# Patient Record
Sex: Male | Born: 1995
Health system: Southern US, Community
[De-identification: ages and names within clinical notes are randomized; demographics above are authoritative.]

## PROBLEM LIST (undated history)

## (undated) HISTORY — PX: OTHER SURGICAL HISTORY: SHX169

## (undated) HISTORY — PX: CIRCUMCISION: SHX1350

---
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
## 2017-07-27 ENCOUNTER — Other Ambulatory Visit: Payer: Self-pay

## 2017-07-27 ENCOUNTER — Encounter (HOSPITAL_COMMUNITY): Payer: Self-pay | Admitting: *Deleted

## 2017-07-27 ENCOUNTER — Emergency Department (HOSPITAL_COMMUNITY): Payer: 59

## 2017-07-27 ENCOUNTER — Emergency Department (HOSPITAL_COMMUNITY)
Admission: EM | Admit: 2017-07-27 | Discharge: 2017-07-27 | Disposition: A | Discharge status: Home / Self Care | Payer: 59 | Attending: Emergency Medicine | Admitting: Emergency Medicine

## 2017-07-27 DIAGNOSIS — N200 Calculus of kidney: Secondary | ICD-10-CM | POA: Diagnosis not present

## 2017-07-27 DIAGNOSIS — Z7722 Contact with and (suspected) exposure to environmental tobacco smoke (acute) (chronic): Secondary | ICD-10-CM | POA: Diagnosis not present

## 2017-07-27 DIAGNOSIS — R1031 Right lower quadrant pain: Secondary | ICD-10-CM | POA: Diagnosis present

## 2017-07-27 DIAGNOSIS — Z79899 Other long term (current) drug therapy: Secondary | ICD-10-CM | POA: Diagnosis not present

## 2017-07-27 LAB — CBC WITH DIFFERENTIAL/PLATELET
Basophils Absolute: 0 10*3/uL (ref 0.0–0.1)
Basophils Relative: 0 %
Eosinophils Absolute: 0 10*3/uL (ref 0.0–0.7)
Eosinophils Relative: 0 %
HCT: 43.8 % (ref 39.0–52.0)
Hemoglobin: 15.2 g/dL (ref 13.0–17.0)
Lymphocytes Relative: 9 %
Lymphs Abs: 1.1 10*3/uL (ref 0.7–4.0)
MCH: 31 pg (ref 26.0–34.0)
MCHC: 34.7 g/dL (ref 30.0–36.0)
MCV: 89.2 fL (ref 78.0–100.0)
Monocytes Absolute: 1 10*3/uL (ref 0.1–1.0)
Monocytes Relative: 9 %
Neutro Abs: 9.7 10*3/uL — ABNORMAL HIGH (ref 1.7–7.7)
Neutrophils Relative %: 82 %
Platelets: 247 10*3/uL (ref 150–400)
RBC: 4.91 MIL/uL (ref 4.22–5.81)
RDW: 12.8 % (ref 11.5–15.5)
WBC: 11.9 10*3/uL — ABNORMAL HIGH (ref 4.0–10.5)

## 2017-07-27 LAB — URINALYSIS, ROUTINE W REFLEX MICROSCOPIC
BILIRUBIN URINE: NEGATIVE
Bacteria, UA: NONE SEEN
GLUCOSE, UA: NEGATIVE mg/dL
Ketones, ur: 20 mg/dL — AB
Leukocytes, UA: NEGATIVE
NITRITE: NEGATIVE
PROTEIN: NEGATIVE mg/dL
Specific Gravity, Urine: >1.046 — ABNORMAL HIGH (ref 1.005–1.030)
pH: 5 (ref 5.0–8.0)

## 2017-07-27 LAB — COMPREHENSIVE METABOLIC PANEL
ALT: 17 U/L (ref 17–63)
AST: 21 U/L (ref 15–41)
Albumin: 4.5 g/dL (ref 3.5–5.0)
Alkaline Phosphatase: 71 U/L (ref 38–126)
Anion gap: 10 (ref 5–15)
BILIRUBIN TOTAL: 1.3 mg/dL — AB (ref 0.3–1.2)
BUN: 16 mg/dL (ref 6–20)
CHLORIDE: 103 mmol/L (ref 101–111)
CO2: 26 mmol/L (ref 22–32)
Calcium: 9.4 mg/dL (ref 8.9–10.3)
Creatinine, Ser: 0.83 mg/dL (ref 0.61–1.24)
Glucose, Bld: 90 mg/dL (ref 65–99)
POTASSIUM: 4.1 mmol/L (ref 3.5–5.1)
Sodium: 139 mmol/L (ref 135–145)
TOTAL PROTEIN: 7 g/dL (ref 6.5–8.1)

## 2017-07-27 LAB — LIPASE, BLOOD: LIPASE: 25 U/L (ref 11–51)

## 2017-07-27 MED ORDER — IOPAMIDOL (ISOVUE-300) INJECTION 61%
INTRAVENOUS | Status: AC
  Administered 2017-07-27: 100 mL
  Filled 2017-07-27: qty 100

## 2017-07-27 MED ORDER — KETOROLAC TROMETHAMINE 30 MG/ML IJ SOLN
30.0000 mg | Freq: Once | INTRAMUSCULAR | Status: AC
  Administered 2017-07-27: 30 mg via INTRAVENOUS
  Filled 2017-07-27: qty 1

## 2017-07-27 MED ORDER — KETOROLAC TROMETHAMINE 30 MG/ML IJ SOLN: 30.0000 mg | Freq: Once | INTRAMUSCULAR | Status: DC

## 2017-07-27 MED ORDER — HYDROCODONE-ACETAMINOPHEN 5-325 MG PO TABS: 1.0000 | ORAL_TABLET | ORAL | 0 refills | Status: DC | PRN

## 2017-07-27 NOTE — ED Provider Notes (Signed)
Patient placed in Quick Look pathway, seen and evaluated   Chief Complaint: Right lower quadrant abdominal pain since this morning HPI:   Right lower quadrant pain that started this morning after having a bowel movement.  Patient states that he did not take any medications prior to arrival.  No abdominal surgeries in the past.   ROS: Abdominal pain (one)  Physical Exam:   Gen: No distress  Neuro: Awake and Alert  Skin: Warm    Focused Exam: Right lower quadrant abdominal pain worse with palpation.   Initiation of care has begun. The patient has been counseled on the process, plan, and necessity for staying for the completion/evaluation, and the remainder of the medical screening examination    Charlestine NightLawyer, Lucylle Foulkes, PA-C 07/27/17 1516    Melene PlanFloyd, Dan, DO 07/27/17 [phone]

## 2017-07-27 NOTE — ED Triage Notes (Signed)
Pt went to go poop this am and then after started having pain to right lower quadrant.  Nauseated, no vomiting.  No back pain.  Pain with ambulation and urination.

## 2017-07-27 NOTE — ED Provider Notes (Addendum)
[facility] Provider Note   CSN: [id number] Arrival date & time: 07/27/17  1457   History   Chief Complaint Chief Complaint  Patient presents with  . Abdominal Pain    HPI Joe Daniel is a 22 y.o. male.  HPI   22 year old male presents today with complaints of right lower quadrant abdominal pain.  Patient reports that he was in his usual state of health this morning when he had acute sharp right lower quadrant pain.  Patient notes that symptoms are sharp in nature with no radiation.  He denies any history of the same.  No medications prior to arrival.  He denies any nausea or vomiting.  Reports normal urination.  No history of abdominal surgeries.  History reviewed. No pertinent past medical history.  Patient Active Problem List   Diagnosis Date Noted  . Migraine without aura and without status migrainosus, not intractable 12/27/2014    Past Surgical History:  Procedure Laterality Date  . CIRCUMCISION  at birth  . OTHER SURGICAL HISTORY     Sinus Surgery x2       Home Medications    Prior to Admission medications   Medication Sig Start Date End Date Taking? Authorizing Provider  amoxicillin-clavulanate (AUGMENTIN) 875-125 MG tablet Take 1 tablet by mouth 2 (two) times daily. 11/06/16   Gilda CreasePollina, Christopher J, MD  HYDROcodone-acetaminophen (NORCO/VICODIN) 5-325 MG tablet Take 1 tablet by mouth every 4 (four) hours as needed. 07/27/17   Pascale Maves, Tinnie GensJeffrey, PA-C  rizatriptan (MAXALT-MLT) 10 MG disintegrating tablet PLACE 1 TAB UNDER TONGUE AT ONSET OF MIGRAINE TAKE W/ 400MG  OF IBUPROFEN. CAN REPEAT IN 2 HOURS 04/28/16   Elveria RisingGoodpasture, Tina, NP    Family History No family history on file.  Social History Social History   Tobacco Use  . Smoking status: Passive Smoke Exposure - Never Smoker  . Smokeless tobacco: Never Used  . Tobacco comment: Sister smokes   Substance Use Topics  . Alcohol use: No    Alcohol/week: 0.0 oz  . Drug  use: No     Allergies   Patient has no known allergies.   Review of Systems Review of Systems  All other systems reviewed and are negative.  Physical Exam Updated Vital Signs BP 130/65   Pulse 78   Temp 98.1 F (36.7 C) (Oral)   Resp 18   SpO2 100%   Physical Exam  Constitutional: He is oriented to person, place, and time. He appears well-developed and well-nourished.  HENT:  Head: Normocephalic and atraumatic.  Eyes: Conjunctivae are normal. Pupils are equal, round, and reactive to light. Right eye exhibits no discharge. Left eye exhibits no discharge. No scleral icterus.  Neck: Normal range of motion. No JVD present. No tracheal deviation present.  Pulmonary/Chest: Effort normal. No stridor.  Abdominal: Soft. He exhibits no distension. There is no tenderness. There is no rebound and no guarding. No hernia.  Neurological: He is alert and oriented to person, place, and time. Coordination normal.  Psychiatric: He has a normal mood and affect. His behavior is normal. Judgment and thought content normal.  Nursing note and vitals reviewed.    ED Treatments / Results  Labs (all labs ordered are listed, but only abnormal results are displayed) Labs Reviewed  COMPREHENSIVE METABOLIC PANEL - Abnormal; Notable for the following components:      Result Value   Total Bilirubin 1.3 (*)    All other components within normal limits  URINALYSIS, ROUTINE W REFLEX MICROSCOPIC -  Abnormal; Notable for the following components:   Color, Urine STRAW (*)    Specific Gravity, Urine >1.046 (*)    Hgb urine dipstick MODERATE (*)    Ketones, ur 20 (*)    Squamous Epithelial / LPF 0-5 (*)    All other components within normal limits  CBC WITH DIFFERENTIAL/PLATELET - Abnormal; Notable for the following components:   WBC 11.9 (*)    Neutro Abs 9.7 (*)    All other components within normal limits  LIPASE, BLOOD    EKG  EKG Interpretation None       Radiology Ct Abdomen Pelvis W  Contrast  Result Date: 07/27/2017 CLINICAL DATA:  Right lower quadrant pain after bowel movement this morning. EXAM: CT ABDOMEN AND PELVIS WITH CONTRAST TECHNIQUE: Multidetector CT imaging of the abdomen and pelvis was performed using the standard protocol following bolus administration of intravenous contrast. CONTRAST:  ISOVUE-300 IOPAMIDOL (ISOVUE-300) INJECTION 61% COMPARISON:  None. FINDINGS: Lower chest: No acute abnormality. Hepatobiliary: No focal liver abnormality is seen. No gallstones, gallbladder wall thickening, or biliary dilatation. Pancreas: Unremarkable. No pancreatic ductal dilatation or surrounding inflammatory changes. Spleen: Normal in size without focal abnormality. Adrenals/Urinary Tract: Normal bilateral adrenal glands. Mild right-sided hydroureteronephrosis attributable to a 4 mm calculus in the mid right hemipelvis, series 3, image 69 and series 6, image 46. This is approximately 3-4 cm from the UVJ. Nondistended urinary bladder. Small phleboliths are seen in the lower pelvis bilaterally. Stomach/Bowel: Normal air-filled appendix. No bowel obstruction or inflammation. Physiologic distention of the stomach. Normal small-bowel rotation. Vascular/Lymphatic: No significant vascular findings are present. No enlarged abdominal or pelvic lymph nodes. Reproductive: Prostate is unremarkable. Other: Trace free fluid in the pelvis. Musculoskeletal: No acute or significant osseous findings. IMPRESSION: 1. 4 mm distal right ureteral calculus approximately 3-4 cm from the UVJ causing mild right-sided hydroureteronephrosis. 2. No acute bowel obstruction or inflammation. Normal air-filled appendix. Electronically Signed   By: Tollie Eth M.D.   On: 07/27/2017 17:04    Procedures Procedures (including critical care time)  Medications Ordered in ED Medications  iopamidol (ISOVUE-300) 61 % injection (100 mLs  Contrast Given 07/27/17 1647)  ketorolac (TORADOL) 30 MG/ML injection 30 mg (30 mg  Intravenous Given 07/27/17 1851)     Initial Impression / Assessment and Plan / ED Course  I have reviewed the triage vital signs and the nursing notes.  Pertinent labs & imaging results that were available during my care of the patient were reviewed by me and considered in my medical decision making (see chart for details).      Final Clinical Impressions(s) / ED Diagnoses   Final diagnoses:  Nephrolithiasis   Labs: UA, CBC,  Lipase,  CMP  Imaging: CT abd pelvis with   Consults:  Therapeutics: Toradol   Discharge Meds: Norco  Assessment/Plan: Pt with acute ureteral lithiasis -mild right sided hydronephrosis- no signs of infection, no complicating features. Well appearing in no acute distress.   Patient without any signs of infectious etiology.  Patient given discharge instructions for follow-up with urology, strict return precautions.  Both patient and his family verbalized understanding and agreement to this plan had no further questions or concerns    ED Discharge Orders        Ordered    HYDROcodone-acetaminophen (NORCO/VICODIN) 5-325 MG tablet  Every 4 hours PRN     07/27/17 1924       Eyvonne Mechanic, PA-C 07/27/17 1925    Eyvonne Mechanic, PA-C 07/27/17 1926  Benjiman Core, MD 07/27/17 2256

## 2017-07-27 NOTE — Discharge Instructions (Signed)
Please read attached information. If you experience any new or worsening signs or symptoms please return to the emergency room for evaluation. Please follow-up with your primary care provider or specialist as discussed. Please use medication prescribed only as directed and discontinue taking if you have any concerning signs or symptoms.   °

## 2018-06-01 IMAGING — CT CT ABD-PELV W/ CM
2 of 4 series · 16 of 46 positions shown, 18 images · IV contrast (APPLIED)
Comparison: None.

CLINICAL DATA: Right lower quadrant pain after bowel movement this
morning.

EXAM:
CT ABDOMEN AND PELVIS WITH CONTRAST
TECHNIQUE: Multidetector CT imaging of the abdomen and pelvis was performed
using the standard protocol following bolus administration of
intravenous contrast.
CONTRAST:  100mL RLGW0I-DVV IOPAMIDOL (RLGW0I-DVV) INJECTION 61%

[Series 3: abd/ pelvis 5.0 i30f 2 · axial · 0.66mm/px · z∈[+845,+1275]mm · 13 of 94 slices shown, 15 images]
[im 4/94  soft-tissue]
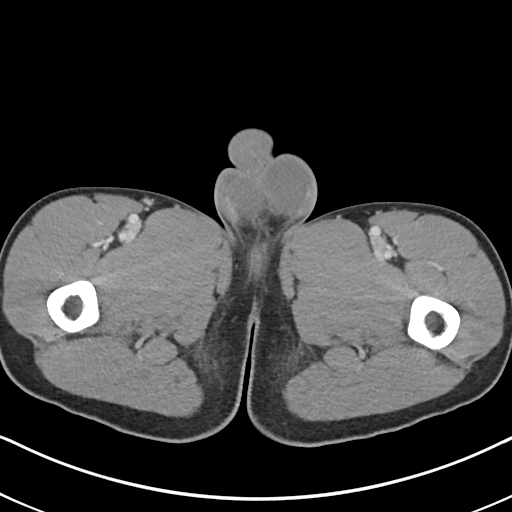
[im 4/94  bone]
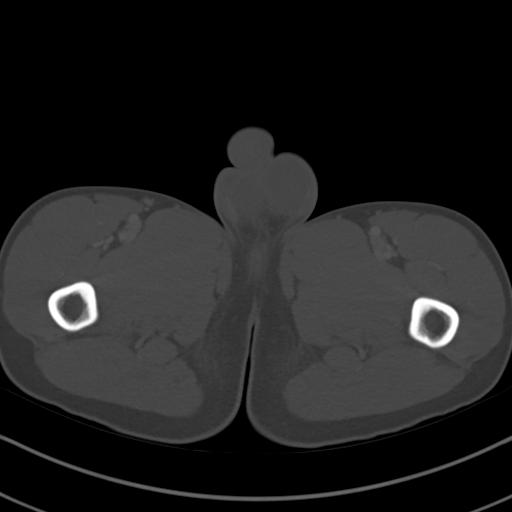
[im 12/94  soft-tissue]
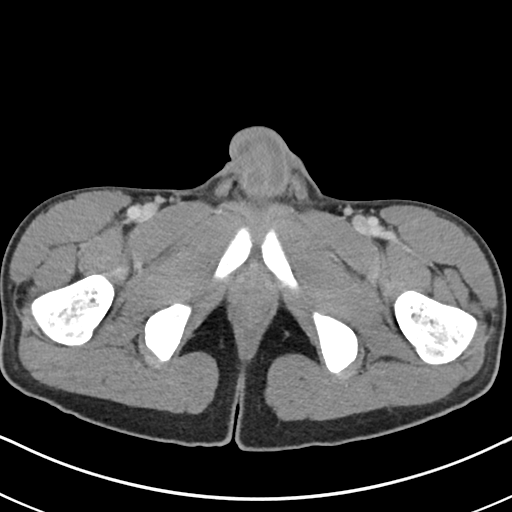
[im 20/94  soft-tissue]
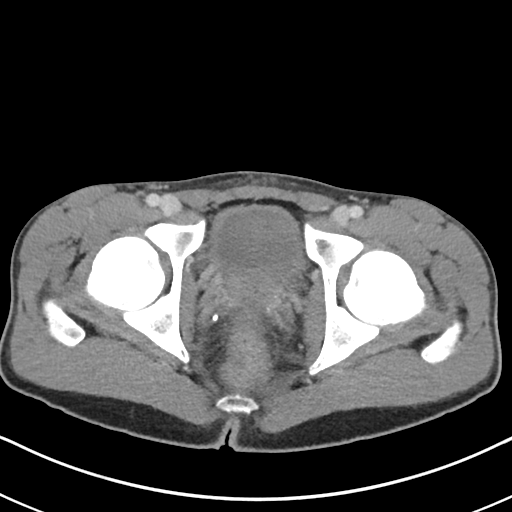
[im 28/94  soft-tissue]
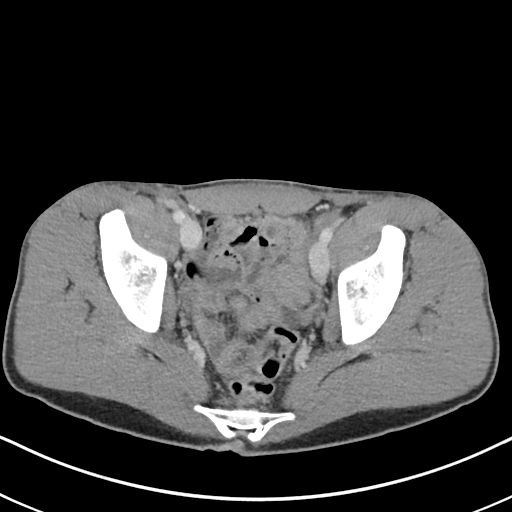
[im 32/94  soft-tissue]
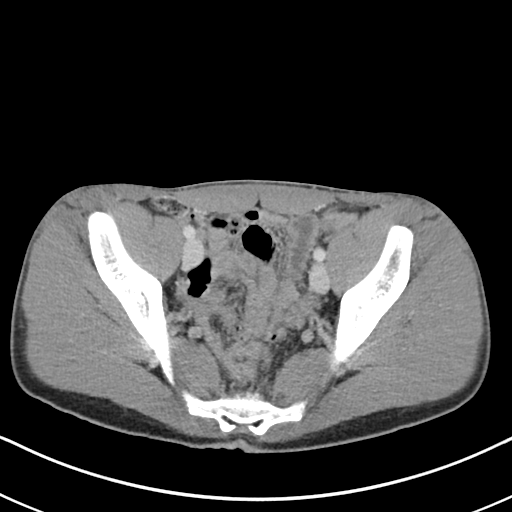
[im 39/94  soft-tissue]
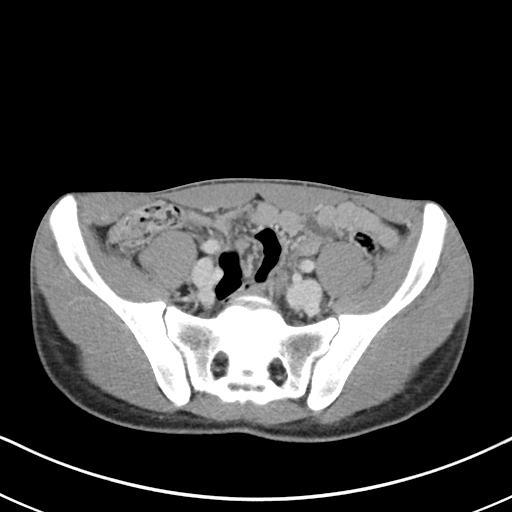
[im 47/94  soft-tissue]
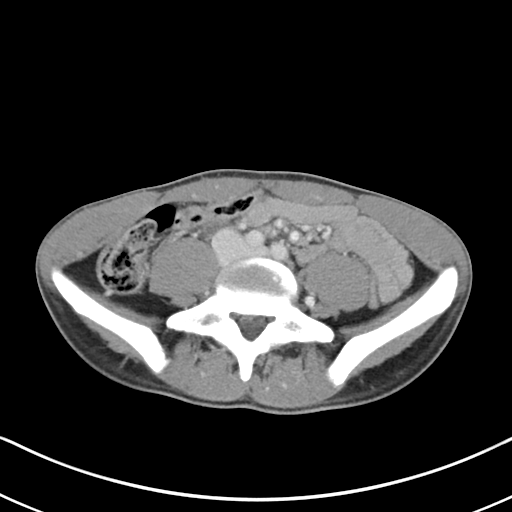
[im 55/94  soft-tissue]
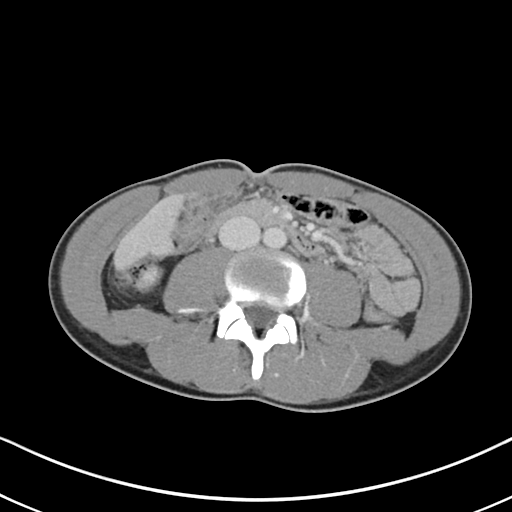
[im 63/94  soft-tissue]
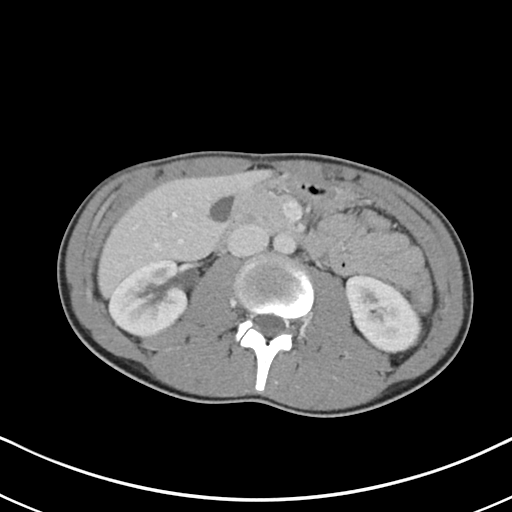
[im 63/94  bone]
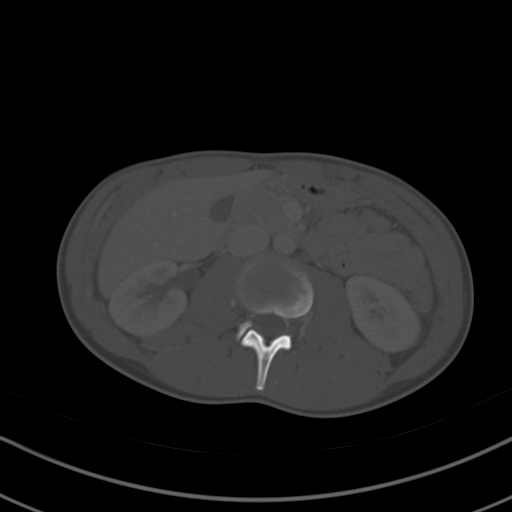
[im 66/94  soft-tissue]
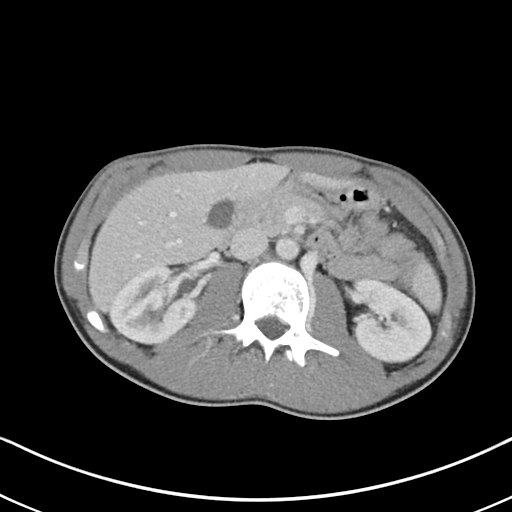
[im 74/94  soft-tissue]
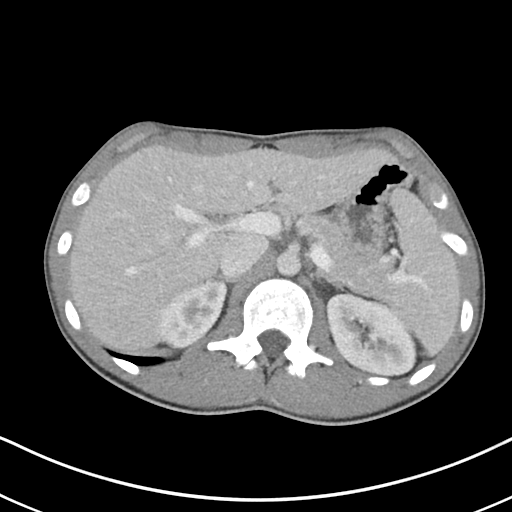
[im 82/94  soft-tissue]
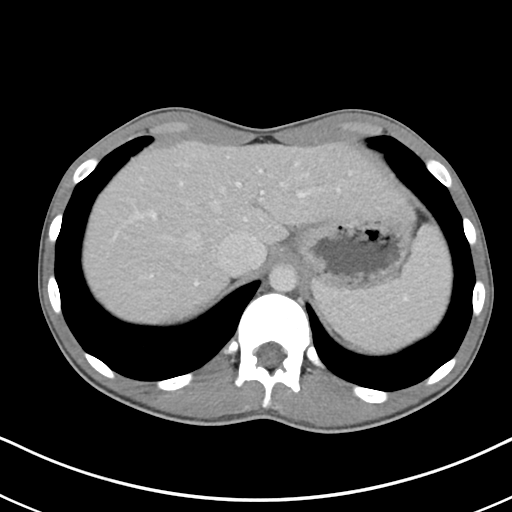
[im 90/94  soft-tissue]
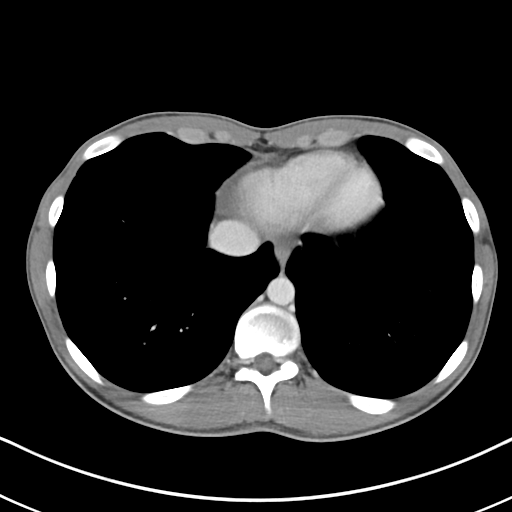

[Series 6: coronal soft tissue · coronal · 0.73mm/px · 3 of 82 slices shown]
[im 28/82  soft-tissue]
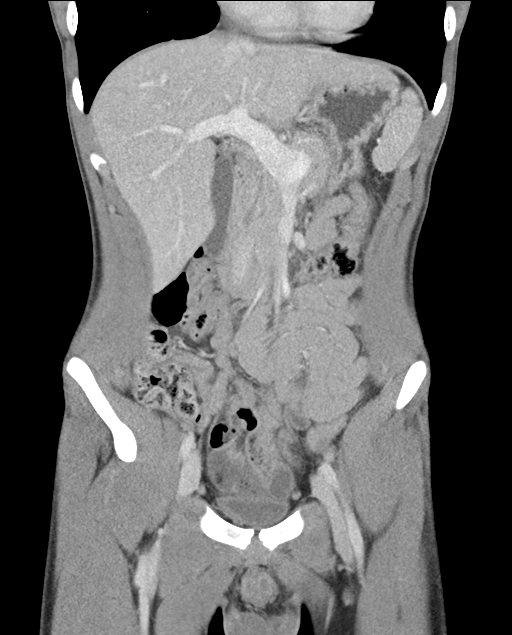
[im 37/82  soft-tissue]
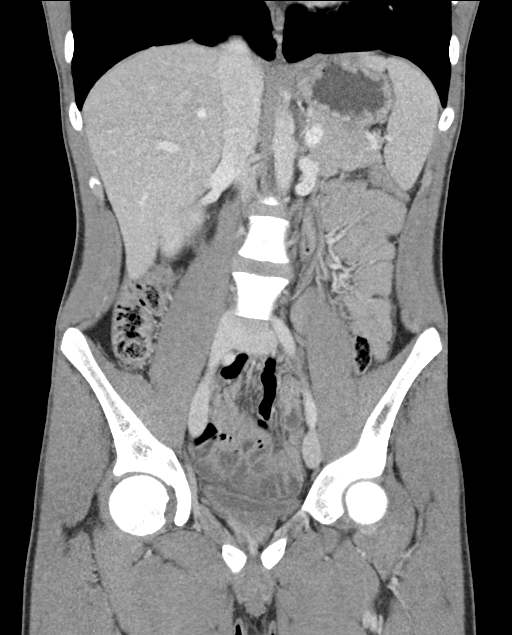
[im 46/82  soft-tissue]
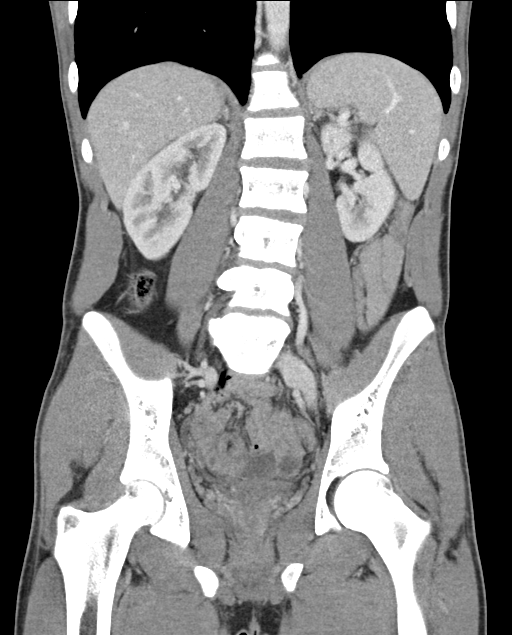

[16 of 46 positions shown; findings below may reference images not displayed]

FINDINGS: Lower chest: No acute abnormality.

Hepatobiliary: No focal liver abnormality is seen. No gallstones,
gallbladder wall thickening, or biliary dilatation.

Pancreas: Unremarkable. No pancreatic ductal dilatation or
surrounding inflammatory changes.

Spleen: Normal in size without focal abnormality.

Adrenals/Urinary Tract: Normal bilateral adrenal glands. Mild
right-sided hydroureteronephrosis attributable to a 4 mm calculus in
the mid right hemipelvis, series 3, image 69 and series 6, image 46.
This is approximately 3-4 cm from the UVJ. Nondistended urinary
bladder. Small phleboliths are seen in the lower pelvis bilaterally.

Stomach/Bowel: Normal air-filled appendix. No bowel obstruction or
inflammation. Physiologic distention of the stomach. Normal
small-bowel rotation.

Vascular/Lymphatic: No significant vascular findings are present. No
enlarged abdominal or pelvic lymph nodes.

Reproductive: Prostate is unremarkable.

Other: Trace free fluid in the pelvis.

Musculoskeletal: No acute or significant osseous findings.
IMPRESSION: 1. 4 mm distal right ureteral calculus approximately 3-4 cm from the
UVJ causing mild right-sided hydroureteronephrosis.
2. No acute bowel obstruction or inflammation. Normal air-filled
appendix.

## 2019-12-05 ENCOUNTER — Encounter (HOSPITAL_COMMUNITY): Payer: Self-pay

## 2019-12-05 ENCOUNTER — Other Ambulatory Visit: Payer: Self-pay

## 2019-12-05 ENCOUNTER — Ambulatory Visit (HOSPITAL_COMMUNITY)
Admission: EM | Admit: 2019-12-05 | Discharge: 2019-12-05 | Disposition: A | Discharge status: Home / Self Care | Payer: 59 | Attending: Physician Assistant | Admitting: Physician Assistant

## 2019-12-05 DIAGNOSIS — Z7722 Contact with and (suspected) exposure to environmental tobacco smoke (acute) (chronic): Secondary | ICD-10-CM | POA: Diagnosis not present

## 2019-12-05 DIAGNOSIS — J029 Acute pharyngitis, unspecified: Secondary | ICD-10-CM | POA: Diagnosis not present

## 2019-12-05 DIAGNOSIS — Z20822 Contact with and (suspected) exposure to covid-19: Secondary | ICD-10-CM | POA: Insufficient documentation

## 2019-12-05 DIAGNOSIS — J020 Streptococcal pharyngitis: Secondary | ICD-10-CM | POA: Diagnosis not present

## 2019-12-05 DIAGNOSIS — Z791 Long term (current) use of non-steroidal anti-inflammatories (NSAID): Secondary | ICD-10-CM | POA: Insufficient documentation

## 2019-12-05 LAB — POCT RAPID STREP A: Streptococcus, Group A Screen (Direct): POSITIVE — AB

## 2019-12-05 MED ORDER — ACETAMINOPHEN 325 MG PO TABS
ORAL_TABLET | ORAL | Status: AC
Start: 2019-12-05 — End: ?
  Filled 2019-12-05: qty 2

## 2019-12-05 MED ORDER — AMOXICILLIN 500 MG PO CAPS: 500.0000 mg | ORAL_CAPSULE | Freq: Three times a day (TID) | ORAL | 0 refills | Status: DC

## 2019-12-05 MED ORDER — ACETAMINOPHEN 325 MG PO TABS
650.0000 mg | ORAL_TABLET | Freq: Once | ORAL | Status: AC
  Administered 2019-12-05: 21:00:00 650 mg via ORAL

## 2019-12-05 NOTE — ED Triage Notes (Signed)
Pt is here with a sore throat that started 3 days ago, pt has taken Advil on the first day to relieve discomfort.

## 2019-12-05 NOTE — ED Provider Notes (Signed)
[facility]    CSN: [id number] Arrival date & time: 12/05/19  1903      History   Chief Complaint Chief Complaint  Patient presents with   Sore Throat    HPI Joe Daniel is a 24 y.o. male.   Pt complains of a sore throat and swollen glands   The history is provided by the patient. No language interpreter was used.  Sore Throat This is a new problem. The current episode started yesterday. The problem occurs constantly. The problem has been gradually worsening. Nothing aggravates the symptoms. Nothing relieves the symptoms. He has tried nothing for the symptoms. The treatment provided no relief.    History reviewed. No pertinent past medical history.  Patient Active Problem List   Diagnosis Date Noted   Migraine without aura and without status migrainosus, not intractable 12/27/2014    Past Surgical History:  Procedure Laterality Date   CIRCUMCISION  at birth   OTHER SURGICAL HISTORY     Sinus Surgery x2       Home Medications    Prior to Admission medications   Medication Sig Start Date End Date Taking? Authorizing Provider  fluticasone (FLONASE) 50 MCG/ACT nasal spray 2 sprays by Each Nare route daily. 07/20/15  Yes [provider]  amoxicillin (AMOXIL) 500 MG capsule Take 1 capsule (500 mg total) by mouth 3 (three) times daily. 12/05/19   Elson Areas, PA-C  amoxicillin (AMOXIL) 500 MG capsule Take 1 capsule (500 mg total) by mouth 3 (three) times daily. 12/05/19   Elson Areas, PA-C  amoxicillin-clavulanate (AUGMENTIN) 875-125 MG tablet Take 1 tablet by mouth 2 (two) times daily. 11/06/16   Gilda Crease, MD  HYDROcodone-acetaminophen (NORCO/VICODIN) 5-325 MG tablet Take 1 tablet by mouth every 4 (four) hours as needed. 07/27/17   Hedges, Tinnie Gens, PA-C  rizatriptan (MAXALT-MLT) 10 MG disintegrating tablet PLACE 1 TAB UNDER TONGUE AT ONSET OF MIGRAINE TAKE W/ 400MG  OF IBUPROFEN. CAN REPEAT IN 2 HOURS 04/28/16   14/4/17,  NP    Family History Family History  Problem Relation Age of Onset   Healthy Mother    Healthy Father     Social History Social History   Tobacco Use   Smoking status: Passive Smoke Exposure - Never Smoker   Smokeless tobacco: Current User   Tobacco comment: Sister smokes   Vaping Use   Vaping Use: Every day  Substance Use Topics   Alcohol use: Yes    Alcohol/week: 0.0 standard drinks   Drug use: No     Allergies   Patient has no known allergies.   Review of Systems Review of Systems  All other systems reviewed and are negative.    Physical Exam Triage Vital Signs ED Triage Vitals  Enc Vitals Group     BP 12/05/19 2022 105/62     Pulse Rate 12/05/19 2022 94     Resp 12/05/19 2022 18     Temp 12/05/19 2022 (!) 100.9 F (38.3 C)     Temp Source 12/05/19 2022 Oral     SpO2 12/05/19 2022 100 %     Weight 12/05/19 2020 144 lb (65.3 kg)     Height --      Head Circumference --      Peak Flow --      Pain Score 12/05/19 2006 0     Pain Loc --      Pain Edu? --      Excl. in GC? --  No data found.  Updated Vital Signs BP 105/62 (BP Location: Right Arm)    Pulse 94    Temp (!) 100.9 F (38.3 C) (Oral)    Resp 18    Wt 65.3 kg    SpO2 100%    BMI 19.53 kg/m   Visual Acuity Right Eye Distance:   Left Eye Distance:   Bilateral Distance:    Right Eye Near:   Left Eye Near:    Bilateral Near:     Physical Exam Vitals and nursing note reviewed.  Constitutional:      Appearance: He is well-developed.  HENT:     Head: Normocephalic and atraumatic.     Mouth/Throat:     Tonsils: Tonsillar exudate and tonsillar abscess present. 2+ on the right. 2+ on the left.  Eyes:     Conjunctiva/sclera: Conjunctivae normal.  Cardiovascular:     Rate and Rhythm: Normal rate and regular rhythm.     Heart sounds: No murmur heard.   Pulmonary:     Effort: Pulmonary effort is normal. No respiratory distress.     Breath sounds: Normal breath sounds.    Abdominal:     Palpations: Abdomen is soft.     Tenderness: There is no abdominal tenderness.  Musculoskeletal:     Cervical back: Neck supple.  Skin:    General: Skin is warm and dry.  Neurological:     General: No focal deficit present.     Mental Status: He is alert.  Psychiatric:        Mood and Affect: Mood normal.      UC Treatments / Results  Labs (all labs ordered are listed, but only abnormal results are displayed) Labs Reviewed  POCT RAPID STREP A - Abnormal; Notable for the following components:      Result Value   Streptococcus, Group A Screen (Direct) POSITIVE (*)    All other components within normal limits  SARS CORONAVIRUS 2 (TAT 6-24 HRS)    EKG   Radiology No results found.  Procedures Procedures (including critical care time)  Medications Ordered in UC Medications  acetaminophen (TYLENOL) tablet 650 mg (650 mg Oral Given 12/05/19 2032)    Initial Impression / Assessment and Plan / UC Course  I have reviewed the triage vital signs and the nursing notes.  Pertinent labs & imaging results that were available during my care of the patient were reviewed by me and considered in my medical decision making (see chart for details).     MDM:  Pt counseled on strep throat.  Final Clinical Impressions(s) / UC Diagnoses   Final diagnoses:  Streptococcal sore throat   Discharge Instructions   None    ED Prescriptions    Medication Sig Dispense Auth. Provider   amoxicillin (AMOXIL) 500 MG capsule Take 1 capsule (500 mg total) by mouth 3 (three) times daily. 30 capsule Earlena Werst K, [place]   amoxicillin (AMOXIL) 500 MG capsule Take 1 capsule (500 mg total) by mouth 3 (three) times daily. 30 capsule Elson Areas, [place]     PDMP not reviewed this encounter.   Elson Areas, [place] 12/05/19 2133

## 2019-12-06 LAB — SARS CORONAVIRUS 2 (TAT 6-24 HRS): SARS Coronavirus 2: NEGATIVE

## 2020-03-06 ENCOUNTER — Other Ambulatory Visit: Payer: Self-pay

## 2020-03-06 ENCOUNTER — Other Ambulatory Visit: Payer: Self-pay | Admitting: *Deleted

## 2020-03-06 ENCOUNTER — Encounter: Payer: Self-pay | Admitting: Family Medicine

## 2020-03-06 ENCOUNTER — Ambulatory Visit (INDEPENDENT_AMBULATORY_CARE_PROVIDER_SITE_OTHER): Payer: 59 | Admitting: Family Medicine

## 2020-03-06 VITALS — BP 93/56 | HR 104 | Temp 97.4°F | Ht 72.0 in | Wt 155.0 lb

## 2020-03-06 DIAGNOSIS — Z0001 Encounter for general adult medical examination with abnormal findings: Secondary | ICD-10-CM

## 2020-03-06 DIAGNOSIS — G43009 Migraine without aura, not intractable, without status migrainosus: Secondary | ICD-10-CM

## 2020-03-06 DIAGNOSIS — Z131 Encounter for screening for diabetes mellitus: Secondary | ICD-10-CM

## 2020-03-06 DIAGNOSIS — Z13220 Encounter for screening for lipoid disorders: Secondary | ICD-10-CM | POA: Diagnosis not present

## 2020-03-06 DIAGNOSIS — L709 Acne, unspecified: Secondary | ICD-10-CM | POA: Insufficient documentation

## 2020-03-06 LAB — EXTRA GRAY-TOP TUBE

## 2020-03-06 NOTE — Assessment & Plan Note (Signed)
Mild.  Recommended facial wash with benzoyl peroxide.  Can also use Differin as needed.

## 2020-03-06 NOTE — Progress Notes (Signed)
Chief Complaint:  Joe Daniel is a 24 y.o. male who presents today for his annual comprehensive physical exam.    Assessment/Plan:  Chronic Problems Addressed Today: Migraine without aura and without status migrainosus, not intractable Mild.  Recommended facial wash with benzoyl peroxide.  Can also use Differin as needed.  Preventative Healthcare: Check lipids and random glucose.  Declined flu vaccine.  Patient Counseling(The following topics were reviewed and/or handout was given):  -Nutrition: Stressed importance of moderation in sodium/caffeine intake, saturated fat and cholesterol, caloric balance, sufficient intake of fresh fruits, vegetables, and fiber.  -Stressed the importance of regular exercise.   -Substance Abuse: Discussed cessation/primary prevention of tobacco, alcohol, or other drug use; driving or other dangerous activities under the influence; availability of treatment for abuse.   -Injury prevention: Discussed safety belts, safety helmets, smoke detector, smoking near bedding or upholstery.   -Sexuality: Discussed sexually transmitted diseases, partner selection, use of condoms, avoidance of unintended pregnancy and contraceptive alternatives.   -Dental health: Discussed importance of regular tooth brushing, flossing, and dental visits.  -Health maintenance and immunizations reviewed. Please refer to Health maintenance section.  Return to care in 1 year for next preventative visit.     Subjective:  HPI:  He has no acute complaints today.   Lifestyle Diet: Balanced. Plenty of fruits and vegetables.  Exercise: Goes to the gym daily.   Depression screen PHQ 2/9 03/06/2020  Decreased Interest 0  Down, Depressed, Hopeless 0  PHQ - 2 Score 0    Health Maintenance Due  Topic Date Due  . Hepatitis C Screening  Never done  . HIV Screening  Never done  . TETANUS/TDAP  Never done     ROS: Per HPI, otherwise a complete review of systems was negative.    PMH:  The following were reviewed and entered/updated in epic: No past medical history on file. Patient Active Problem List   Diagnosis Date Noted  . Acne 03/06/2020  . Migraine without aura and without status migrainosus, not intractable 12/27/2014   Past Surgical History:  Procedure Laterality Date  . CIRCUMCISION  at birth  . NOSE SURGERY  2006  . OTHER SURGICAL HISTORY     Sinus Surgery x2    Family History  Problem Relation Age of Onset  . Healthy Mother   . Healthy Father   . Healthy Sister   . Healthy Sister     Medications- reviewed and updated Current Outpatient Medications  Medication Sig Dispense Refill  . fluticasone (FLONASE) 50 MCG/ACT nasal spray 2 sprays by Each Nare route daily. (Patient not taking: Reported on 03/06/2020)     No current facility-administered medications for this visit.    Allergies-reviewed and updated No Known Allergies  Social History   Socioeconomic History  . Marital status: Single    Spouse name: Not on file  . Number of children: Not on file  . Years of education: Not on file  . Highest education level: Not on file  Occupational History  . Not on file  Tobacco Use  . Smoking status: Passive Smoke Exposure - Never Smoker  . Smokeless tobacco: Current User  . Tobacco comment: Sister smokes   Vaping Use  . Vaping Use: Every day  . Substances: Nicotine, Flavoring, Nicotine-salt  Substance and Sexual Activity  . Alcohol use: Yes    Comment: occ  . Drug use: Yes    Frequency: 3.0 times per week    Types: Marijuana  . Sexual activity: Not Currently  Birth control/protection: Condom  Other Topics Concern  . Not on file  Social History Narrative  . Not on file   Social Determinants of Health   Financial Resource Strain:   . Difficulty of Paying Living Expenses: Not on file  Food Insecurity:   . Worried About Programme researcher, broadcasting/film/video in the Last Year: Not on file  . Ran Out of Food in the Last Year: Not on file   Transportation Needs:   . Lack of Transportation (Medical): Not on file  . Lack of Transportation (Non-Medical): Not on file  Physical Activity:   . Days of Exercise per Week: Not on file  . Minutes of Exercise per Session: Not on file  Stress:   . Feeling of Stress : Not on file  Social Connections:   . Frequency of Communication with Friends and Family: Not on file  . Frequency of Social Gatherings with Friends and Family: Not on file  . Attends Religious Services: Not on file  . Active Member of Clubs or Organizations: Not on file  . Attends Banker Meetings: Not on file  . Marital Status: Not on file        Objective:  Physical Exam: BP (!) 93/56   Pulse (!) 104   Temp (!) 97.4 F (36.3 C) (Temporal)   Ht 6' (1.829 m)   Wt 155 lb (70.3 kg)   SpO2 96%   BMI 21.02 kg/m   Body mass index is 21.02 kg/m. Wt Readings from Last 3 Encounters:  03/06/20 155 lb (70.3 kg)  12/05/19 144 lb (65.3 kg)  11/06/16 150 lb (68 kg) (45 %, Z= -0.13)*   * Growth percentiles are based on CDC (Boys, 2-20 Years) data.   Gen: NAD, resting comfortably HEENT: TMs normal bilaterally. OP clear. No thyromegaly noted.  CV: RRR with no murmurs appreciated Pulm: NWOB, CTAB with no crackles, wheezes, or rhonchi GI: Normal bowel sounds present. Soft, Nontender, Nondistended. MSK: no edema, cyanosis, or clubbing noted Skin: warm, dry Neuro: CN2-12 grossly intact. Strength 5/5 in upper and lower extremities. Reflexes symmetric and intact bilaterally.  Psych: Normal affect and thought content     Kymere Fullington M. Jimmey Ralph, MD 03/06/2020 2:38 PM

## 2020-03-06 NOTE — Patient Instructions (Signed)
It was very nice to see you today!  Please make sure that you are using a facial wash that has benzoyl peroxide or salicylic acid.  You can use Differin as needed to spot treat different areas of outbreak.  We will check blood work today.  I will see you back in a year.  Please come back to see me sooner if needed.  Take care, Dr Jerline Pain  Please try these tips to maintain a healthy lifestyle:   Eat at least 3 REAL meals and 1-2 snacks per day.  Aim for no more than 5 hours between eating.  If you eat breakfast, please do so within one hour of getting up.    Each meal should contain half fruits/vegetables, one quarter protein, and one quarter carbs (no bigger than a computer mouse)   Cut down on sweet beverages. This includes juice, soda, and sweet tea.     Drink at least 1 glass of water with each meal and aim for at least 8 glasses per day   Exercise at least 150 minutes every week.    Preventive Care 57-22 Years Old, Male Preventive care refers to lifestyle choices and visits with your health care provider that can promote health and wellness. This includes:  A yearly physical exam. This is also called an annual well check.  Regular dental and eye exams.  Immunizations.  Screening for certain conditions.  Healthy lifestyle choices, such as eating a healthy diet, getting regular exercise, not using drugs or products that contain nicotine and tobacco, and limiting alcohol use. What can I expect for my preventive care visit? Physical exam Your health care provider will check:  Height and weight. These may be used to calculate body mass index (BMI), which is a measurement that tells if you are at a healthy weight.  Heart rate and blood pressure.  Your skin for abnormal spots. Counseling Your health care provider may ask you questions about:  Alcohol, tobacco, and drug use.  Emotional well-being.  Home and relationship well-being.  Sexual activity.  Eating  habits.  Work and work Statistician. What immunizations do I need?  Influenza (flu) vaccine  This is recommended every year. Tetanus, diphtheria, and pertussis (Tdap) vaccine  You may need a Td booster every 10 years. Varicella (chickenpox) vaccine  You may need this vaccine if you have not already been vaccinated. Human papillomavirus (HPV) vaccine  If recommended by your health care provider, you may need three doses over 6 months. Measles, mumps, and rubella (MMR) vaccine  You may need at least one dose of MMR. You may also need a second dose. Meningococcal conjugate (MenACWY) vaccine  One dose is recommended if you are 74-76 years old and a Market researcher living in a residence hall, or if you have one of several medical conditions. You may also need additional booster doses. Pneumococcal conjugate (PCV13) vaccine  You may need this if you have certain conditions and were not previously vaccinated. Pneumococcal polysaccharide (PPSV23) vaccine  You may need one or two doses if you smoke cigarettes or if you have certain conditions. Hepatitis A vaccine  You may need this if you have certain conditions or if you travel or work in places where you may be exposed to hepatitis A. Hepatitis B vaccine  You may need this if you have certain conditions or if you travel or work in places where you may be exposed to hepatitis B. Haemophilus influenzae type b (Hib) vaccine  You may  need this if you have certain risk factors. You may receive vaccines as individual doses or as more than one vaccine together in one shot (combination vaccines). Talk with your health care provider about the risks and benefits of combination vaccines. What tests do I need? Blood tests  Lipid and cholesterol levels. These may be checked every 5 years starting at age 36.  Hepatitis C test.  Hepatitis B test. Screening   Diabetes screening. This is done by checking your blood sugar (glucose)  after you have not eaten for a while (fasting).  Sexually transmitted disease (STD) testing. Talk with your health care provider about your test results, treatment options, and if necessary, the need for more tests. Follow these instructions at home: Eating and drinking   Eat a diet that includes fresh fruits and vegetables, whole grains, lean protein, and low-fat dairy products.  Take vitamin and mineral supplements as recommended by your health care provider.  Do not drink alcohol if your health care provider tells you not to drink.  If you drink alcohol: ? Limit how much you have to 0-2 drinks a day. ? Be aware of how much alcohol is in your drink. In the [place] one drink equals one 12 oz bottle of beer (355 mL), one 5 oz glass of wine (148 mL), or one 1 oz glass of hard liquor (44 mL). Lifestyle  Take daily care of your teeth and gums.  Stay active. Exercise for at least 30 minutes on 5 or more days each week.  Do not use any products that contain nicotine or tobacco, such as cigarettes, e-cigarettes, and chewing tobacco. If you need help quitting, ask your health care provider.  If you are sexually active, practice safe sex. Use a condom or other form of protection to prevent STIs (sexually transmitted infections). What's next?  Go to your health care provider once a year for a well check visit.  Ask your health care provider how often you should have your eyes and teeth checked.  Stay up to date on all vaccines. This information is not intended to replace advice given to you by your health care provider. Make sure you discuss any questions you have with your health care provider. Document Revised: 05/06/2018 Document Reviewed: 05/06/2018 Elsevier Patient Education  2020 [employer].

## 2020-03-07 LAB — LIPID PANEL
Cholesterol: 106 mg/dL (ref <200)
HDL: 47 mg/dL (ref >=40)
LDL Cholesterol (Calc): 43 mg/dL (calc)
Non-HDL Cholesterol (Calc): 59 mg/dL (calc) (ref <130)
Total CHOL/HDL Ratio: 2.3 (calc) (ref <5.0)
Triglycerides: 80 mg/dL (ref <150)

## 2020-03-07 LAB — GLUCOSE, RANDOM: Glucose, Plasma: 81 mg/dL (ref 65–139)

## 2020-03-07 NOTE — Progress Notes (Signed)
Dr Lavone Neri interpretation of your lab work:  Good news!  All of your blood work was NORMAL.  Keep up the good work and we can recheck in 1 year.  If you have any additional questions, please give [place] a call or send [place] a message through [place].  Take care, Dr Jimmey Ralph

## 2021-05-03 ENCOUNTER — Encounter: Payer: Self-pay | Admitting: Family Medicine

## 2021-05-03 ENCOUNTER — Ambulatory Visit (INDEPENDENT_AMBULATORY_CARE_PROVIDER_SITE_OTHER): Payer: 59 | Admitting: Family Medicine

## 2021-05-03 ENCOUNTER — Other Ambulatory Visit: Payer: Self-pay

## 2021-05-03 VITALS — BP 123/70 | HR 94 | Temp 98.4°F | Ht 72.0 in | Wt 155.5 lb

## 2021-05-03 DIAGNOSIS — R21 Rash and other nonspecific skin eruption: Secondary | ICD-10-CM

## 2021-05-03 MED ORDER — TRIAMCINOLONE ACETONIDE 0.1 % EX CREA: 1.0000 "application " | TOPICAL_CREAM | Freq: Two times a day (BID) | CUTANEOUS | 0 refills | Status: AC

## 2021-05-03 MED ORDER — KETOCONAZOLE 1 % EX SHAM: MEDICATED_SHAMPOO | CUTANEOUS | 3 refills | Status: AC

## 2021-05-03 MED ORDER — KETOCONAZOLE 2 % EX CREA: 1.0000 "application " | TOPICAL_CREAM | Freq: Two times a day (BID) | CUTANEOUS | 0 refills | Status: AC

## 2021-05-03 NOTE — Progress Notes (Signed)
Subjective:     Patient ID: Joe Daniel, male    DOB: 08-Sep-1995, 25 y.o.   MRN: [id number]  Chief Complaint  Patient presents with   Rash    Started 4 or 5 months ago on hands and legs Recently finished prednisone from urgent care, has now spread all over body      Rash Pt c/o rash for 4-5 months. Started on hands.  Gets on inner thighs as well.  Can be mult other places, but these are the worst.  Itchy at HS.  Will leave "heads" on skin.  Went to UC 2-3 wks ago and on pred which helped, but now rash back off meds.  Had returned 3 days after finished pred.  No new meds, soaps, etc.  Worse w/latex gloves. 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  No major h/o eczema, but in winter, will get "spot" near R cheek. Has tried benadryl cream but rash will return in another spot.   No steroid creams nor antihistamines orally tried.   No asthma/cough/sob.  Derm-no appts avail for 1 month or so so wants relief.  Uses Head and shoulders shampoo for dandruff-controls well.    Health Maintenance Due  Topic Date Due   HPV VACCINES (1 - Male 2-dose series) Never done   HIV Screening  Never done   Hepatitis C Screening  Never done   TETANUS/TDAP  Never done    History reviewed. No pertinent past medical history.  Past Surgical History:  Procedure Laterality Date   CIRCUMCISION  at birth   NOSE SURGERY  2006   OTHER SURGICAL HISTORY     Sinus Surgery x2    Outpatient Medications Prior to Visit  Medication Sig Dispense Refill   fluticasone (FLONASE) 50 MCG/ACT nasal spray 2 sprays by Each Nare route daily. (Patient not taking: No sig reported)     No facility-administered medications prior to visit.    No Known Allergies      Objective:    Physical Exam Constitutional:      General: He is not in acute distress.    Appearance: Normal appearance.  Eyes:     Conjunctiva/sclera: Conjunctivae normal.  Skin:    General: Skin is warm and dry.     Findings: Rash present.     Comments:  Scattered small patches of pink, m/p areas all over including palms/hands  Neurological:     Mental Status: He is alert and oriented to person, place, and time.  Psychiatric:        Mood and Affect: Mood normal.    BP 123/70   Pulse 94   Temp 98.4 F (36.9 C) (Temporal)   Ht 6' (1.829 m)   Wt 155 lb 8 oz (70.5 kg)   SpO2 99%   BMI 21.09 kg/m  Wt Readings from Last 3 Encounters:  05/03/21 155 lb 8 oz (70.5 kg)  03/06/20 155 lb (70.3 kg)  12/05/19 144 lb (65.3 kg)        Assessment & Plan:  Rash-suspect some seb derm("dandruff") and rash on chest.  Will do ketoconazole shampoo and cream.  Other areas, suspect eczema(doubt allergy).  Advised otc claritin.  Moisturizers.  Will add triamcinolone cream to affected areas.  Pt offered derm referral but he will call and sch own appt and see how meds work  Problem List Items Addressed This Visit   None Visit Diagnoses     Rash and nonspecific skin eruption    -  Primary  Meds ordered this encounter  Medications   ketoconazole (NIZORAL) 2 % cream    Sig: Apply 1 application topically 2 (two) times daily.    Dispense:  60 g    Refill:  0   KETOCONAZOLE, TOPICAL, 1 % SHAM    Sig: Apply to scalp and affected areas.  Leave on for 5 minutes and then rinse off.  Do 3x weekly    Dispense:  125 mL    Refill:  3   triamcinolone cream (KENALOG) 0.1 %    Sig: Apply 1 application topically 2 (two) times daily.    Dispense:  30 g    Refill:  0   Yovanny Coats M.

## 2021-05-03 NOTE — Patient Instructions (Signed)
Great to see you.   Creams and shampoo sent to pharmacy.

## 2023-10-05 DIAGNOSIS — Z419 Encounter for procedure for purposes other than remedying health state, unspecified: Secondary | ICD-10-CM | POA: Diagnosis not present

## 2023-11-05 DIAGNOSIS — Z419 Encounter for procedure for purposes other than remedying health state, unspecified: Secondary | ICD-10-CM | POA: Diagnosis not present

## 2023-12-05 DIAGNOSIS — Z419 Encounter for procedure for purposes other than remedying health state, unspecified: Secondary | ICD-10-CM | POA: Diagnosis not present

## 2024-01-05 DIAGNOSIS — Z419 Encounter for procedure for purposes other than remedying health state, unspecified: Secondary | ICD-10-CM | POA: Diagnosis not present

## 2024-02-05 DIAGNOSIS — Z419 Encounter for procedure for purposes other than remedying health state, unspecified: Secondary | ICD-10-CM | POA: Diagnosis not present

## 2024-03-06 DIAGNOSIS — Z419 Encounter for procedure for purposes other than remedying health state, unspecified: Secondary | ICD-10-CM | POA: Diagnosis not present

## 2024-03-08 ENCOUNTER — Other Ambulatory Visit: Payer: Self-pay

## 2024-03-08 ENCOUNTER — Encounter (HOSPITAL_BASED_OUTPATIENT_CLINIC_OR_DEPARTMENT_OTHER): Payer: Self-pay | Admitting: *Deleted

## 2024-03-08 ENCOUNTER — Emergency Department (HOSPITAL_BASED_OUTPATIENT_CLINIC_OR_DEPARTMENT_OTHER)
Admission: EM | Admit: 2024-03-08 | Discharge: 2024-03-08 | Disposition: A | Discharge status: Home / Self Care | Payer: Worker's Compensation | Attending: Emergency Medicine | Admitting: Emergency Medicine

## 2024-03-08 DIAGNOSIS — T25211A Burn of second degree of right ankle, initial encounter: Secondary | ICD-10-CM | POA: Diagnosis present

## 2024-03-08 DIAGNOSIS — Y93G3 Activity, cooking and baking: Secondary | ICD-10-CM | POA: Insufficient documentation

## 2024-03-08 DIAGNOSIS — Y990 Civilian activity done for income or pay: Secondary | ICD-10-CM | POA: Insufficient documentation

## 2024-03-08 DIAGNOSIS — T310 Burns involving less than 10% of body surface: Secondary | ICD-10-CM | POA: Insufficient documentation

## 2024-03-08 DIAGNOSIS — X101XXA Contact with hot food, initial encounter: Secondary | ICD-10-CM | POA: Insufficient documentation

## 2024-03-08 DIAGNOSIS — T300 Burn of unspecified body region, unspecified degree: Secondary | ICD-10-CM

## 2024-03-08 DIAGNOSIS — Z23 Encounter for immunization: Secondary | ICD-10-CM | POA: Diagnosis not present

## 2024-03-08 MED ORDER — TETANUS-DIPHTH-ACELL PERTUSSIS 5-2-15.5 LF-MCG/0.5 IM SUSP
0.5000 mL | Freq: Once | INTRAMUSCULAR | Status: AC
  Administered 2024-03-08: 0.5 mL via INTRAMUSCULAR
  Filled 2024-03-08: qty 0.5

## 2024-03-08 MED ORDER — IBUPROFEN 400 MG PO TABS
600.0000 mg | ORAL_TABLET | Freq: Once | ORAL | Status: AC
  Administered 2024-03-08: 600 mg via ORAL
  Filled 2024-03-08: qty 1

## 2024-03-08 MED ORDER — BACITRACIN ZINC 500 UNIT/GM EX OINT
TOPICAL_OINTMENT | Freq: Two times a day (BID) | CUTANEOUS | Status: DC
  Filled 2024-03-08: qty 28.35

## 2024-03-08 NOTE — ED Provider Notes (Signed)
  [place] EMERGENCY DEPARTMENT AT [facility] Provider Note   CSN: [id number] Arrival date & time: 03/08/24  [id number]     Patient presents with: Burn (Workers Comp)   Joe Daniel is a 28 y.o. male.   Patient to ED for evaluation and treatment of burn to right ankle while cooking at work when hot liquid spilled. No other injury.  The history is provided by the patient. No language interpreter was used.  Burn      Prior to Admission medications   Medication Sig Start Date End Date Taking? Authorizing Provider  ketoconazole  (NIZORAL ) 2 % cream Apply 1 application topically 2 (two) times daily. 05/03/21   Wendolyn Jenkins Jansky, MD  KETOCONAZOLE , TOPICAL, 1 % SHAM Apply to scalp and affected areas.  Leave on for 5 minutes and then rinse off.  Do 3x weekly 05/03/21   Wendolyn Jenkins Jansky, MD  triamcinolone  cream (KENALOG ) 0.1 % Apply 1 application topically 2 (two) times daily. 05/03/21   Wendolyn Jenkins Jansky, MD    Allergies: Patient has no known allergies.    Review of Systems  Updated Vital Signs BP 131/85   Pulse 78   Temp 98.4 F (36.9 C) (Oral)   Resp 18   SpO2 96%   Physical Exam Constitutional:      Appearance: He is well-developed.  Pulmonary:     Effort: Pulmonary effort is normal.  Musculoskeletal:        General: Normal range of motion.     Cervical back: Normal range of motion.     Comments: Blister formed on anterior right ankle measuring 1 cm x 3 cm. Intact. Minimal erythema surrounding the area. No swelling. FROM of the ankle without limitation.  Skin:    General: Skin is warm and dry.  Neurological:     Mental Status: He is alert and oriented to person, place, and time.     (all labs ordered are listed, but only abnormal results are displayed) Labs Reviewed - No data to display  EKG: None  Radiology: No results found.   Procedures   Medications Ordered in the ED  Tdap (ADACEL) injection 0.5 mL (0.5 mLs Intramuscular Given 03/08/24 1810)   ibuprofen  (ADVIL ) tablet 600 mg (600 mg Oral Given 03/08/24 1809)    Clinical Course as of 03/10/24 2029  Thu Mar 10, 2024  2028 2nd degree burn, small area, intact blister on right ankle. Care instructions discussed, including need for follow up burn care.  [SU]    Clinical Course User Index [SU] Odell Balls, PA-C                                 Medical Decision Making Risk Prescription drug management.        Final diagnoses:  Burn by hot liquid    ED Discharge Orders     None          Odell Balls, DEVONNA 03/10/24 2029    Dreama Longs, MD 03/14/24 1134

## 2024-03-08 NOTE — Discharge Instructions (Signed)
 As we discussed, clean the area twice daily with soap and water and keep covered. When the blister opens, it will need to be re-examined for further care. You can return to the ED or go to any Urgent Care to have this done.   Ibuprofen  is recommended for pain and inflammation, but you can take Tylenol  at the same time if more pain control is needed.

## 2024-03-08 NOTE — ED Notes (Signed)
 Pt alert and oriented X 4 at the time of discharge. RR even and unlabored. No acute distress noted. Pt verbalized understanding of discharge instructions as discussed. Pt ambulatory to lobby at time of discharge.

## 2024-03-08 NOTE — ED Notes (Signed)
 Wound care performed. Wound washer with wound wash and bacitracin ointment  and non adherent bandage applied. Pt tolerated well. Pt also educated on how to perform wound care at home. Pt voiced understanding.

## 2024-03-08 NOTE — ED Triage Notes (Signed)
 Pt arrives POV from work due to spilling some hot marinara sauce on his ankle.  Pt has some blistering.  Last tetanus shot unknown.
